# Patient Record
Sex: Male | Born: 1960 | Race: White | Hispanic: No | Marital: Single | Smoking: Never smoker
Health system: Southern US, Community
[De-identification: ages and names within clinical notes are randomized; demographics above are authoritative.]

## PROBLEM LIST (undated history)

## (undated) DIAGNOSIS — M1990 Unspecified osteoarthritis, unspecified site: Secondary | ICD-10-CM

## (undated) DIAGNOSIS — M109 Gout, unspecified: Secondary | ICD-10-CM

## (undated) DIAGNOSIS — E785 Hyperlipidemia, unspecified: Secondary | ICD-10-CM

## (undated) HISTORY — DX: Unspecified osteoarthritis, unspecified site: M19.90

## (undated) HISTORY — PX: ELBOW SURGERY: SHX618

## (undated) HISTORY — PX: SHOULDER SURGERY: SHX246

## (undated) HISTORY — DX: Hyperlipidemia, unspecified: E78.5

## (undated) HISTORY — PX: COLONOSCOPY: SHX174

## (undated) HISTORY — DX: Gout, unspecified: M10.9

---
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
## 1997-07-09 HISTORY — PX: WRIST SURGERY: SHX841

## 1998-03-25 ENCOUNTER — Emergency Department (HOSPITAL_COMMUNITY): Admission: EM | Admit: 1998-03-25 | Discharge: 1998-03-25 | Payer: Self-pay | Admitting: *Deleted

## 1998-08-17 ENCOUNTER — Ambulatory Visit (HOSPITAL_BASED_OUTPATIENT_CLINIC_OR_DEPARTMENT_OTHER): Admission: RE | Admit: 1998-08-17 | Discharge: 1998-08-17 | Payer: Self-pay | Admitting: Orthopedic Surgery

## 1999-08-02 ENCOUNTER — Ambulatory Visit (HOSPITAL_BASED_OUTPATIENT_CLINIC_OR_DEPARTMENT_OTHER): Admission: RE | Admit: 1999-08-02 | Discharge: 1999-08-02 | Payer: Self-pay | Admitting: Orthopedic Surgery

## 2010-07-31 ENCOUNTER — Encounter: Payer: Self-pay | Admitting: Family Medicine

## 2011-07-10 DIAGNOSIS — M109 Gout, unspecified: Secondary | ICD-10-CM

## 2011-07-10 HISTORY — DX: Gout, unspecified: M10.9

## 2015-03-03 ENCOUNTER — Other Ambulatory Visit: Payer: Self-pay | Admitting: Family Medicine

## 2015-03-03 ENCOUNTER — Ambulatory Visit
Admission: RE | Admit: 2015-03-03 | Discharge: 2015-03-03 | Disposition: A | Discharge status: Home / Self Care | Payer: BLUE CROSS/BLUE SHIELD | Source: Ambulatory Visit | Attending: Family Medicine | Admitting: Family Medicine

## 2015-03-03 DIAGNOSIS — M1990 Unspecified osteoarthritis, unspecified site: Secondary | ICD-10-CM

## 2016-07-03 IMAGING — CR DG HAND 2V*L*
2 series · 2 of 2 positions shown · non-contrast
Comparison: None.

CLINICAL DATA: Hand pain, right side greater than left.

EXAM:
LEFT HAND - 2 VIEW

[x hand pa left]
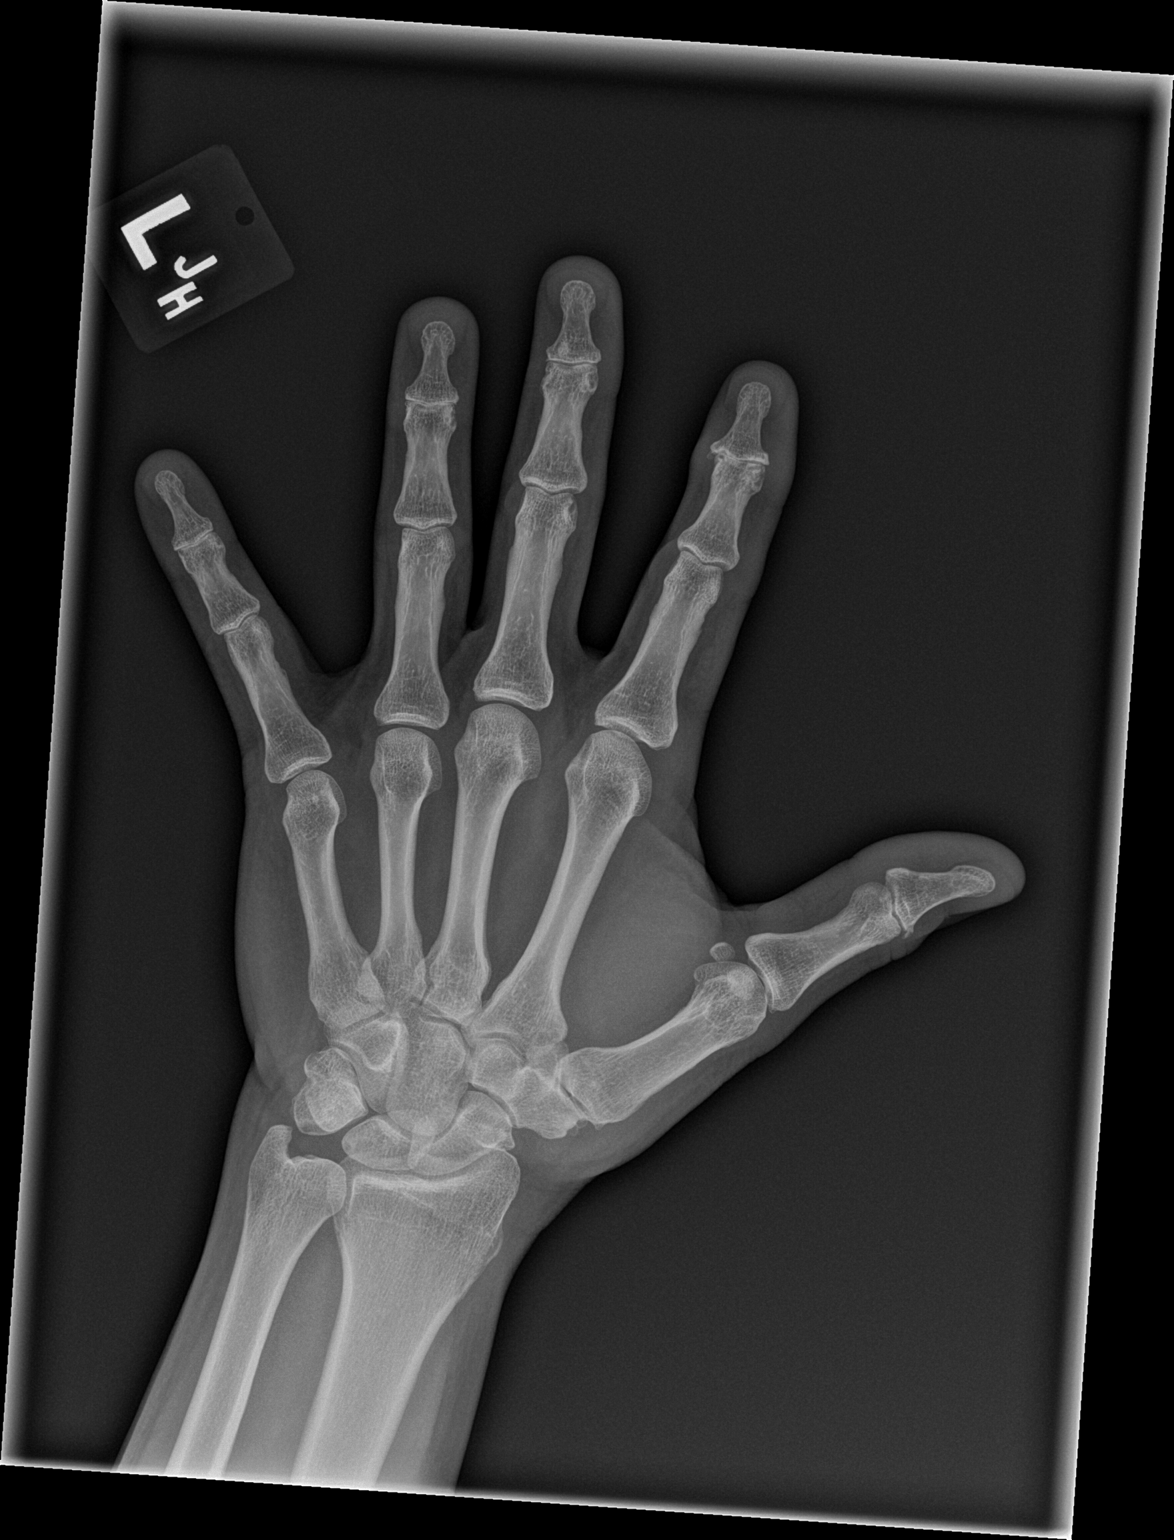

[x hand lat left]
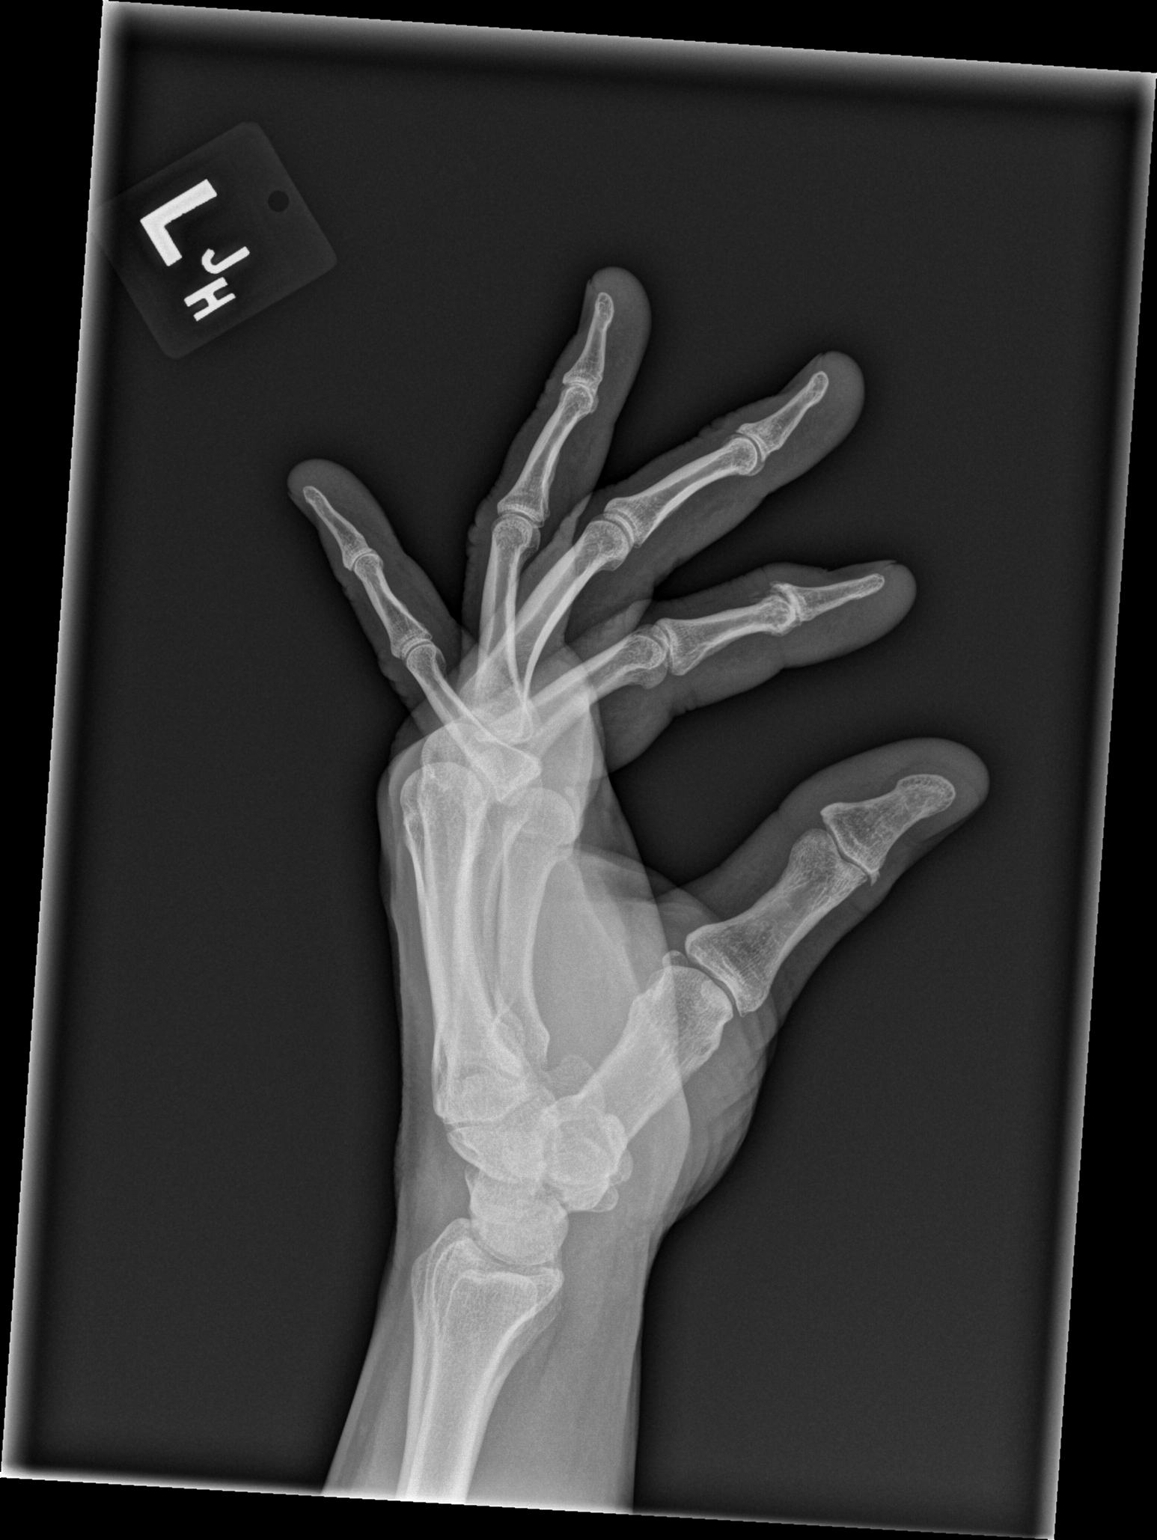

[2 of 2 positions shown; findings below may reference images not displayed]

FINDINGS: No acute bony or joint abnormality identified. No evidence of
fracture or dislocation. DJD base of left thumb at the first
carpometacarpal joint. Degenerative changes noted of the
interphalangeal joints.
IMPRESSION: 1.  No acute abnormality. No evidence of fracture or dislocation .

2. Degenerative changes first carpometacarpal joint and
interphalangeal joints.

## 2017-01-22 ENCOUNTER — Encounter: Payer: Self-pay | Admitting: Student

## 2017-01-22 ENCOUNTER — Ambulatory Visit (INDEPENDENT_AMBULATORY_CARE_PROVIDER_SITE_OTHER): Payer: BLUE CROSS/BLUE SHIELD | Admitting: Student

## 2017-01-22 VITALS — BP 108/68 | HR 52 | Temp 97.7°F | Ht 69.0 in | Wt 178.8 lb

## 2017-01-22 DIAGNOSIS — E785 Hyperlipidemia, unspecified: Secondary | ICD-10-CM

## 2017-01-22 DIAGNOSIS — M19049 Primary osteoarthritis, unspecified hand: Secondary | ICD-10-CM | POA: Insufficient documentation

## 2017-01-22 DIAGNOSIS — R001 Bradycardia, unspecified: Secondary | ICD-10-CM

## 2017-01-22 DIAGNOSIS — R252 Cramp and spasm: Secondary | ICD-10-CM | POA: Diagnosis not present

## 2017-01-22 DIAGNOSIS — Z8739 Personal history of other diseases of the musculoskeletal system and connective tissue: Secondary | ICD-10-CM

## 2017-01-22 DIAGNOSIS — M19041 Primary osteoarthritis, right hand: Secondary | ICD-10-CM | POA: Diagnosis not present

## 2017-01-22 DIAGNOSIS — Z0000 Encounter for general adult medical examination without abnormal findings: Secondary | ICD-10-CM | POA: Insufficient documentation

## 2017-01-22 NOTE — Assessment & Plan Note (Signed)
Lipid panel today. He is fasting. Continue pravastatin and Aspirin for now. He may not need these

## 2017-01-22 NOTE — Assessment & Plan Note (Addendum)
Will review his medical records when I receive them from his previous provider. He has signed a medical release form today. It seems his uptodate on most health maintenance issues. Updated history in his chart. Baseline blood work (CMP, CBC with diff, TSH and lipid panel today) Encouraged him to keep up the exercise and diet Advised not to drink more than two 8 oz beers or equivalent in a day.

## 2017-01-22 NOTE — Assessment & Plan Note (Signed)
Continue allopurinol for now

## 2017-01-22 NOTE — Progress Notes (Signed)
Subjective:     Gary Delgado is a 56 y.o. old male here to establish care.   HPI Patient used to go to [facility] for his care. He says his last visit was about 3 months ago for ear infection. He reports signing a release form about a month ago. Unfortunately, we have not received his medical records yet. He reports having normal colonoscopy about 8 years ago. He says he is up-to-date on his tetanus vaccine. He denies history of hypertension, diabetes, heart disease, lung disease or GI disease. He admits history of OA and gout.  He reports regular exercise. He bikes about 4 days a week. He reports getting some leg cramps after biking that resolves with hydration.   PMH/Problem List: has Hyperlipidemia; Routine adult health maintenance; OA (osteoarthritis) of finger; Hx of gout; and Leg cramping on his problem list.  [facility]  Patient is adopted.   [place] Social History  Substance Use Topics  . Smoking status: Never Smoker  . Smokeless tobacco: Never Used  . Alcohol use Yes     Comment: two cans a day    Additional Depression: Describes his mood as "happy" EtOH abuse: occ > 2 beers  Tobacco use: never smoker Drug use: Denies Work: Educational psychologist for [employer] Exercise: rids a bike 4 days a week Diet: eat healthy as possible except chips.  Sexual activity: married Has a living will and MOST  Screening Colon Cancer Screening: in 2010, normal Sleep Apnea Screening: Negative  Skin Cancer Screening: no skin lesion Opthalmology Visit: had his eye checked two years ago Dental Visit: a bout a month ago  Immunizations Says he had tetanus vaccine at [facility]   Review of Systems  Constitutional: Negative for diaphoresis, fatigue, fever and unexpected weight change.  HENT: Negative for congestion, hearing loss, trouble swallowing and voice change.   Eyes: Negative for visual disturbance.  Respiratory: Negative for cough, chest tightness and shortness of breath.     Cardiovascular: Negative for chest pain, palpitations and leg swelling.  Gastrointestinal: Negative for abdominal pain, blood in stool and diarrhea.  Endocrine: Negative for cold intolerance, heat intolerance, polydipsia, polyphagia and polyuria.  Genitourinary: Negative for dysuria, frequency, hematuria and scrotal swelling.  Musculoskeletal: Negative for arthralgias and myalgias.  Skin: Negative for rash.  Neurological: Negative for dizziness, light-headedness and headaches.  Hematological: Negative for adenopathy. Does not bruise/bleed easily.  Psychiatric/Behavioral: Negative for dysphoric mood. The patient is not nervous/anxious.        Objective:   Physical Exam Vitals:   01/22/17 0852  BP: 108/68  Pulse: (!) 52  Temp: 97.7 F (36.5 C)  TempSrc: Oral  SpO2: 98%  Weight: 178 lb 12.8 oz (81.1 kg)  Height: 5\' 9"  (1.753 m)   Body mass index is 26.4 kg/m.  GEN: appears well, no apparent distress. Head: normocephalic and atraumatic  Eyes: conjunctiva without injection, sclera anicteric Ears: external ear and ear canal normal Nares: no rhinorrhea, congestion or erythema Oropharynx: mmm without erythema or exudation HEM: negative for cervical or periauricular lymphadenopathies CVS: Bradycardia to 52, RR, nl s1 & s2, no murmurs, no edema,  2+ DP & PT pulses bilaterally RESP: no IWOB, good air movement bilaterally, CTAB GI: BS present & normal, soft, NTND, no guarding, no rebound, no mass GU: no suprapubic or CVA tenderness MSK: no focal tenderness or notable swelling. Heberden's nodes on DIP SKIN: no apparent skin lesion ENDO: negative thyromegally NEURO: alert and oiented appropriately, no gross defecits  PSYCH: mood "  happy" with congruent affect     Assessment & Plan:  Routine adult health maintenance Will review his medical records when I receive them from his previous provider. He has signed a medical release form today. It seems his uptodate on most health  maintenance issues. Updated history in his chart. Baseline blood work (CMP, CBC with diff, TSH and lipid panel today) Encouraged him to keep up the exercise and diet Advised not to drink more than two 8 oz beers or equivalent in a day.    Hyperlipidemia Lipid panel today. He is fasting. Continue pravastatin and Aspirin for now. He may not need these  Leg cramping Likely due to dehydration from over exertion. Will check CMP to rule out electrolyte issues.   Hx of gout Continue allopurinol for now

## 2017-01-22 NOTE — Patient Instructions (Addendum)
It was great seeing you today! We have addressed the following issues today 1. Staying healthy: Keep up the good work with exercise and diet 2.   For your medical records: someone will get in touch when we receive your medical records from [place] if we have questions or concerns. Otherwise, we will see you back in a year.   If we did any lab work today, and the results require attention, either me or my nurse will get in touch with you. If everything is normal, you will get a letter in mail and a message via . If you don't hear from [place] in two weeks, please give [place] a call. Otherwise, we look forward to seeing you again at your next visit. If you have any questions or concerns before then, please call the clinic at [phone].  Please bring all your medications to every doctors visit  Sign up for My Chart to have easy access to your labs results, and communication with your Primary care physician.    Please check-out at the front desk before leaving the clinic.    Take Care,   Dr. Cyndia Skeeters

## 2017-01-22 NOTE — Assessment & Plan Note (Signed)
Likely due to dehydration from over exertion. Will check CMP to rule out electrolyte issues.

## 2017-01-24 ENCOUNTER — Encounter: Payer: Self-pay | Admitting: Student

## 2017-01-24 LAB — CBC WITH DIFFERENTIAL/PLATELET
BASOS: 1 %
Basophils Absolute: 0.1 10*3/uL (ref 0.0–0.2)
EOS (ABSOLUTE): 0.1 10*3/uL (ref 0.0–0.4)
EOS: 2 %
HEMATOCRIT: 49.1 % (ref 37.5–51.0)
Hemoglobin: 15 g/dL (ref 13.0–17.7)
Immature Grans (Abs): 0 10*3/uL (ref 0.0–0.1)
Immature Granulocytes: 0 %
LYMPHS ABS: 1.7 10*3/uL (ref 0.7–3.1)
Lymphs: 33 %
MCH: 28.7 pg (ref 26.6–33.0)
MCHC: 30.5 g/dL — AB (ref 31.5–35.7)
MCV: 94 fL (ref 79–97)
MONOS ABS: 0.4 10*3/uL (ref 0.1–0.9)
Monocytes: 7 %
Neutrophils Absolute: 3 10*3/uL (ref 1.4–7.0)
Neutrophils: 57 %
Platelets: 321 10*3/uL (ref 150–379)
RBC: 5.23 x10E6/uL (ref 4.14–5.80)
RDW: 13.7 % (ref 12.3–15.4)
WBC: 5.2 10*3/uL (ref 3.4–10.8)

## 2017-01-24 LAB — LIPID PANEL
CHOLESTEROL TOTAL: 188 mg/dL (ref 100–199)
Chol/HDL Ratio: 3.4 ratio (ref 0.0–5.0)
HDL: 56 mg/dL (ref >39)
LDL Calculated: 114 mg/dL — ABNORMAL HIGH (ref 0–99)
TRIGLYCERIDES: 88 mg/dL (ref 0–149)
VLDL CHOLESTEROL CAL: 18 mg/dL (ref 5–40)

## 2017-01-24 LAB — CMP14+EGFR
A/G RATIO: 2.2 (ref 1.2–2.2)
ALBUMIN: 5 g/dL (ref 3.5–5.5)
ALK PHOS: 40 IU/L (ref 39–117)
ALT: 25 IU/L (ref 0–44)
AST: 29 IU/L (ref 0–40)
BUN / CREAT RATIO: 15 (ref 9–20)
BUN: 17 mg/dL (ref 6–24)
Bilirubin Total: 0.7 mg/dL (ref 0.0–1.2)
CO2: 22 mmol/L (ref 20–29)
CREATININE: 1.14 mg/dL (ref 0.76–1.27)
Calcium: 10.3 mg/dL — ABNORMAL HIGH (ref 8.7–10.2)
Chloride: 100 mmol/L (ref 96–106)
GFR calc Af Amer: 81 mL/min/{1.73_m2} (ref >59)
GFR calc non Af Amer: 70 mL/min/{1.73_m2} (ref >59)
GLOBULIN, TOTAL: 2.3 g/dL (ref 1.5–4.5)
Glucose: 94 mg/dL (ref 65–99)
Potassium: 4.9 mmol/L (ref 3.5–5.2)
SODIUM: 141 mmol/L (ref 134–144)
Total Protein: 7.3 g/dL (ref 6.0–8.5)

## 2017-01-24 LAB — TSH: TSH: 2.01 u[IU]/mL (ref 0.450–4.500)

## 2017-05-28 ENCOUNTER — Telehealth: Payer: Self-pay | Admitting: Student

## 2017-05-28 MED ORDER — ALLOPURINOL 100 MG PO TABS: 100.0000 mg | ORAL_TABLET | Freq: Every day | ORAL | 3 refills | Status: DC

## 2017-05-28 NOTE — Telephone Encounter (Signed)
Refilled his allopurinol

## 2017-05-28 NOTE — Telephone Encounter (Signed)
Pt called and said he got a phone call from pharmacy saying his meds were ready to pick up. He got to the pharmacy and only the pravastatin was ready and they said he would need a refill from dr for the allopurinol. Pt said both the prescriptions have his old dr name on them and he wants to get that changed since he comes here now. He needs a refill on allopurinal now with new PCP name on it and the refill for pravastatin also needs new PCP on it for the future. Please advise

## 2017-07-18 ENCOUNTER — Other Ambulatory Visit: Payer: Self-pay

## 2017-07-18 ENCOUNTER — Encounter: Payer: Self-pay | Admitting: Student in an Organized Health Care Education/Training Program

## 2017-07-18 ENCOUNTER — Ambulatory Visit (INDEPENDENT_AMBULATORY_CARE_PROVIDER_SITE_OTHER): Payer: BLUE CROSS/BLUE SHIELD | Admitting: Student in an Organized Health Care Education/Training Program

## 2017-07-18 DIAGNOSIS — L570 Actinic keratosis: Secondary | ICD-10-CM | POA: Insufficient documentation

## 2017-07-18 DIAGNOSIS — L819 Disorder of pigmentation, unspecified: Secondary | ICD-10-CM | POA: Diagnosis not present

## 2017-07-18 NOTE — Assessment & Plan Note (Addendum)
Right sided nose lesion, small. - Asked patient to follow up in procedures/derm clinic in our office for lesion to be frozen off Patient agreeable to this plan.

## 2017-07-18 NOTE — Progress Notes (Signed)
   CC: Skin lesions  HPI: Gary Delgado is a 57 y.o. male with PMH significant for HLD, gout who presents to [facility] today with two skin lesions he would like [place] to look.   Face lesion: Patient reports left sided nose lesion of 6 months duration. It is small, slightly raised, and when he scratches at it the lesion will bleed. It will go away for a while but always comes back. No drainage or redness surrounding the lesion. No known history of skin cancer. Positive history of exposure to sunlight. No known family history, patient is adopted. No similar lesions elsewhere.  Right shoulder lesion: Flat ring of slight hyperpigmentation that has lasted multiple years. The lesion is a little bit darker in the summer. It has not grown. It has not changed color. It is not raised. It does not have irregular borders.   Review of Symptoms:  See HPI for ROS.   CC, SH/smoking status, and VS noted.  Objective: BP 118/70 (BP Location: Left Arm, Patient Position: Sitting, Cuff Size: Large)   Pulse 77   Temp 98 F (36.7 C) (Oral)   Ht 5\' 9"  (1.753 m)   Wt 189 lb (85.7 kg)   SpO2 99%   BMI 27.91 kg/m  GEN: NAD, alert, cooperative, and pleasant. SKIN:  Face: +small raised lesion with roughness to the touch and small associated telangectasias. No redness or drainage, no warmth. Shoulder: Right shoulder flat area of hyperpigmentation, clear in the middle. Confluent with the rest of the skin (not raised).  Assessment and plan:  Actinic keratosis of the right nose Right sided nose lesion, small. - Asked patient to follow up in procedures/derm clinic in our office for lesion to be frozen off Patient agreeable to this plan.  Hyperpigmentation 3 cm ring if mild hyperpigmentation on right shoulder, not concerning for melanoma. Not likely fungal given duration of 2-3 years without change. - monitor  Everrett Coombe, MD,MS,  PGY2 07/18/2017 11:43 AM

## 2017-07-18 NOTE — Patient Instructions (Signed)
It was a pleasure seeing you today in our clinic. Today we discussed your skin lesions. Here is the treatment plan we have discussed and agreed upon together:  For the lesion on your face, please schedule a visit to be seen in procedures clinic so that it may be frozen off.  Our clinic's number is [phone]. Please call with questions or concerns about what we discussed today.  Be well, Dr. Burr Medico

## 2017-07-18 NOTE — Assessment & Plan Note (Signed)
3 cm ring if mild hyperpigmentation on right shoulder, not concerning for melanoma. Not likely fungal given duration of 2-3 years without change. - monitor

## 2017-08-08 ENCOUNTER — Other Ambulatory Visit: Payer: Self-pay

## 2017-08-08 ENCOUNTER — Ambulatory Visit (INDEPENDENT_AMBULATORY_CARE_PROVIDER_SITE_OTHER): Payer: BLUE CROSS/BLUE SHIELD | Admitting: Family Medicine

## 2017-08-08 VITALS — BP 106/74 | HR 64 | Temp 97.3°F | Wt 188.0 lb

## 2017-08-08 DIAGNOSIS — L821 Other seborrheic keratosis: Secondary | ICD-10-CM

## 2017-08-08 DIAGNOSIS — L719 Rosacea, unspecified: Secondary | ICD-10-CM

## 2017-08-08 MED ORDER — METRONIDAZOLE 0.75 % EX CREA: TOPICAL_CREAM | Freq: Two times a day (BID) | CUTANEOUS | 0 refills | Status: DC

## 2017-08-08 NOTE — Patient Instructions (Signed)
Rosacea Rosacea is a long-term (chronic) condition that affects the skin of the face, including the cheeks, nose, brow, and chin. This condition can also affect the eyes. Rosacea causes blood vessels near the surface of the skin to enlarge, which results in redness. What are the causes? The cause of this condition is not known. Certain triggers can make rosacea worse, including:  Hot baths.  Exercise.  Sunlight.  Very hot or cold temperatures.  Hot or spicy foods and drinks.  Drinking alcohol.  Stress.  Taking blood pressure medicine.  Long-term use of topical steroids on the face.  What increases the risk? This condition is more likely to develop in:  People who are older than 59 years of age.  Women.  People who have light-colored skin (light complexion).  People who have a family history of rosacea.  What are the signs or symptoms? Symptoms of this condition include:  Redness of the face.  Red bumps or pimples on the face.  A red, enlarged nose.  Blushing easily.  Red lines on the skin.  Irritated or burning feeling in the eyes.  Swollen eyelids.  How is this diagnosed? This condition is diagnosed with a medical history and physical exam. How is this treated? There is no cure for this condition, but treatment can help to control your symptoms. Your health care provider may recommend that you see a skin specialist (dermatologist). Treatment may include:  Antibiotic medicines that are applied to the skin or taken as a pill.  Laser treatment to improve the appearance of the skin.  Surgery. This is rare.  Your health care provider will also recommend the best way to take care of your skin. Even after your skin improves, you will likely need to continue treatment to prevent your rosacea from coming back. Follow these instructions at home: Skin Care Take care of your skin as told by your health care provider. You may be told to do these things:  Wash  your skin gently two or more times each day.  Use mild soap.  Use a sunscreen or sunblock with SPF 30 or greater.  Use gentle cosmetics that are meant for sensitive skin.  Shave with an electric shaver instead of a blade.  Lifestyle  Try to keep track of what foods trigger this condition. Avoid any triggers. These may include: ? Spicy foods. ? Seafood. ? Cheese. ? Hot liquids. ? Nuts. ? Chocolate. ? Iodized salt.  Do not drink alcohol.  Avoid extremely cold or hot temperatures.  Try to reduce your stress. If you need help, talk with your health care provider.  When you exercise, do these things to stay cool: ? Limit your sun exposure. ? Use a fan. ? Do shorter and more frequent intervals of exercise. General instructions  Keep all follow-up visits as told by your health care provider. This is important.  Take over-the-counter and prescription medicines only as told by your health care provider.  If your eyelids are affected, apply warm compresses to them. Do this as told by your health care provider.  If you were prescribed an antibiotic medicine, apply or take it as told by your health care provider. Do not stop using the antibiotic even if your condition improves. Contact a health care provider if:  Your symptoms get worse.  Your symptoms do not improve after two months of treatment.  You have new symptoms.  You have any changes in vision or you have problems with your eyes, such as   redness or itching.  You feel depressed.  You lose your appetite.  You have trouble concentrating. This information is not intended to replace advice given to you by your health care provider. Make sure you discuss any questions you have with your health care provider. Document Released: 08/02/2004 Document Revised: 12/01/2015 Document Reviewed: 09/01/2014 Elsevier Interactive Patient Education  2018 Elsevier Inc.  

## 2017-08-08 NOTE — Progress Notes (Signed)
   Subjective:    Patient ID: Gary Delgado, male    DOB: 1960/09/26, 57 y.o.   MRN: [id number]   CC: Skin lesion on nose  HPI: Patient is 57 yo male who presents today for skin lesion on his nose. Patient was referred to [facility] by Dr.Feng on 07/18/17 for cryotherapy after a diagnosis of actinic keratosis. Patient reports that lesion has been present since the summer. He has been picking at it and it occasionally bleeds but but never really heals and has been recurring. Patient has not tried any medications or therapy for it. Patient also complain of skin lesion that he has noticed on his back on the left side. It has not bother him but wanted to know if it was concerning for skin cancer.  Smoking status reviewed   ROS: all other systems were reviewed and are negative other than in the HPI   Past Medical History:  Diagnosis Date  . Gout 2013   Never had a flare up since then  . Osteoarthritis     Past Surgical History:  Procedure Laterality Date  . ELBOW SURGERY Right   . KNEE SURGERY Left 1970  . SHOULDER SURGERY Left   . WRIST SURGERY Right 1999    Past medical history, surgical, family, and social history reviewed and updated in the EMR as appropriate.  Objective:  BP 106/74   Pulse 64   Temp (!) 97.3 F (36.3 C) (Oral)   Wt 188 lb (85.3 kg)   SpO2 95%   BMI 27.76 kg/m   Vitals and nursing note reviewed      General: NAD, pleasant, able to participate in exam Cardiac: RRR, normal heart sounds, no murmurs. 2+ radial and PT pulses bilaterally Respiratory: CTAB, normal effort, No wheezes, rales or rhonchi Abdomen: soft, nontender, nondistended, no hepatic or splenomegaly, +BS Extremities: no edema or cyanosis. WWP. Skin: small erythematous lesion on bridge of nose associated with Telangiectasias, not scaly, or raised, non bleeding or draining, non tender. No fluctuance. 1 cm x 1 cm raised papules well circumscribed, slightly hyperpigmented, no scales or  erythema associated. Neuro: alert and oriented x4, no focal deficits Psych: Normal affect and mood   Assessment & Plan:   #Nose lesion, chronic, unchanged  Patient presented for cryotherapy for actinic keratosis diagnosis. On exam, lesion is more consistent to rosacea with associated telangiectasia. Does not have scaly appearance found in [place]. Will treat as rosacea and continue to monitor. Counseled patient on trigger such as spicy food, excessive sunlight, alcohol, etc. If lesion does not improve with above plan, patient will return for biopsy and further evaluation. --Metronidazole 0.75 % cream apply bid to affected areas for two weeks --Follow up in clinic if lesions does not improve  #Back lesion, acute Lesion on back consistent with seborrhoic keratosis with "stuck on" appearance raised border and well demarcated. No pruritus reported. Will continue to monitor. Patient decline cryotherapy for the time being.    Marjie Skiff, MD [facility] PGY-2

## 2017-11-21 ENCOUNTER — Telehealth: Payer: Self-pay | Admitting: Family Medicine

## 2017-11-21 NOTE — Telephone Encounter (Signed)
Patient was seen at the dermatology clinic in January for lesion on his nose which appears to be rosacea. I called to follow-up with him and to schedule follow-up appointment for reassessment as recommended.  (monitoring recommended during last visit).  He stated he is upset because he was initially misdiagnosed by the resident and told he has skin cancer. He had to come in to be seen twice and he got a $200 bill for both visits which he had to pay out of pocket.  I apologize for the misconception and advised him that we will like to work with him and make things right. 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 He understands and he will let us know if he will like to schedule an appointment with [place].  So far, he stated his nose is fine and there has been no change to his facial skin since last visit.

## 2017-12-19 ENCOUNTER — Encounter

## 2018-06-17 ENCOUNTER — Telehealth: Payer: Self-pay | Admitting: Family Medicine

## 2018-06-17 NOTE — Telephone Encounter (Signed)
Spoke with pt about overdue colonoscopy. Pt states he has switched providers due to how his last appt went a year ago and had already spoken with someone in the [facility] about having switched. He was not the happiest about receiving a phone call. -[place]

## 2019-03-23 ENCOUNTER — Encounter: Payer: Self-pay | Admitting: Internal Medicine

## 2019-04-02 ENCOUNTER — Ambulatory Visit (AMBULATORY_SURGERY_CENTER): Payer: Self-pay | Admitting: *Deleted

## 2019-04-02 ENCOUNTER — Other Ambulatory Visit: Payer: Self-pay

## 2019-04-02 VITALS — Temp 96.6°F | Ht 69.0 in | Wt 180.0 lb

## 2019-04-02 DIAGNOSIS — Z1211 Encounter for screening for malignant neoplasm of colon: Secondary | ICD-10-CM

## 2019-04-02 MED ORDER — SUPREP BOWEL PREP KIT 17.5-3.13-1.6 GM/177ML PO SOLN: 1.0000 | Freq: Once | ORAL | 0 refills | Status: AC

## 2019-04-02 NOTE — Progress Notes (Signed)
No egg or soy allergy known to patient  No issues with past sedation with any surgeries  or procedures, no intubation problems  No diet pills per patient- hard to wake post colon  No home 02 use per patient  No blood thinners per patient  Pt denies issues with constipation  No A fib or A flutter  EMMI video sent to pt's e mail  $15 suprep coupon   Due to the COVID-19 pandemic we are asking patients to follow these guidelines. Please only bring one care partner. Please be aware that your care partner may wait in the car in the parking lot or if they feel like they will be too hot to wait in the car, they may wait in the lobby on the 4th floor. All care partners are required to wear a mask the entire time (we do not have any that we can provide them), they need to practice social distancing, and we will do a Covid check for all patient's and care partners when you arrive. Also we will check their temperature and your temperature. If the care partner waits in their car they need to stay in the parking lot the entire time and we will call them on their cell phone when the patient is ready for discharge so they can bring the car to the front of the building. Also all patient's will need to wear a mask into building.

## 2019-04-03 ENCOUNTER — Encounter: Payer: Self-pay | Admitting: Internal Medicine

## 2019-04-14 ENCOUNTER — Telehealth: Payer: Self-pay

## 2019-04-14 NOTE — Telephone Encounter (Signed)
Covid-19 screening questions   Do you now or have you had a fever in the last 14 days? NO   Do you have any respiratory symptoms of shortness of breath or cough now or in the last 14 days? NO  Do you have any family members or close contacts with diagnosed or suspected Covid-19 in the past 14 days? NO  Have you been tested for Covid-19 and found to be positive? NO        

## 2019-04-15 ENCOUNTER — Other Ambulatory Visit: Payer: Self-pay

## 2019-04-15 ENCOUNTER — Encounter: Payer: Self-pay | Admitting: Internal Medicine

## 2019-04-15 ENCOUNTER — Other Ambulatory Visit: Payer: Self-pay | Admitting: Internal Medicine

## 2019-04-15 ENCOUNTER — Ambulatory Visit (AMBULATORY_SURGERY_CENTER): Payer: BC Managed Care – PPO | Admitting: Internal Medicine

## 2019-04-15 VITALS — BP 110/72 | HR 55 | Temp 98.2°F | Resp 14 | Ht 69.0 in | Wt 180.0 lb

## 2019-04-15 DIAGNOSIS — D122 Benign neoplasm of ascending colon: Secondary | ICD-10-CM

## 2019-04-15 DIAGNOSIS — Z1211 Encounter for screening for malignant neoplasm of colon: Secondary | ICD-10-CM | POA: Diagnosis present

## 2019-04-15 MED ORDER — SODIUM CHLORIDE 0.9 % IV SOLN: 500.0000 mL | INTRAVENOUS | Status: AC

## 2019-04-15 NOTE — Progress Notes (Signed)
A and O x3. Report to RN. Tolerated MAC anesthesia well.

## 2019-04-15 NOTE — Patient Instructions (Signed)
YOU HAD AN ENDOSCOPIC PROCEDURE TODAY AT THE [facility]:   Refer to the procedure report that was given to you for any specific questions about what was found during the examination.  If the procedure report does not answer your questions, please call your gastroenterologist to clarify.  If you requested that your care partner not be given the details of your procedure findings, then the procedure report has been included in a sealed envelope for you to review at your convenience later.  YOU SHOULD EXPECT: Some feelings of bloating in the abdomen. Passage of more gas than usual.  Walking can help get rid of the air that was put into your GI tract during the procedure and reduce the bloating. If you had a lower endoscopy (such as a colonoscopy or flexible sigmoidoscopy) you may notice spotting of blood in your stool or on the toilet paper. If you underwent a bowel prep for your procedure, you may not have a normal bowel movement for a few days.  Please Note:  You might notice some irritation and congestion in your nose or some drainage.  This is from the oxygen used during your procedure.  There is no need for concern and it should clear up in a day or so.  SYMPTOMS TO REPORT IMMEDIATELY:   Following lower endoscopy (colonoscopy or flexible sigmoidoscopy):  Excessive amounts of blood in the stool  Significant tenderness or worsening of abdominal pains  Swelling of the abdomen that is new, acute  Fever of 100F or higher   For urgent or emergent issues, a gastroenterologist can be reached at any hour by calling [phone].   DIET:  We do recommend a small meal at first, but then you may proceed to your regular diet.  Drink plenty of fluids but you should avoid alcoholic beverages for 24 hours.  MEDICATIONS: Continue present medications.  Please see handouts given to you by your recovery nurse.  ACTIVITY:  You should plan to take it easy for the rest of today and you should  NOT DRIVE or use heavy machinery until tomorrow (because of the sedation medicines used during the test).    FOLLOW UP: Our staff will call the number listed on your records 48-72 hours following your procedure to check on you and address any questions or concerns that you may have regarding the information given to you following your procedure. If we do not reach you, we will leave a message.  We will attempt to reach you two times.  During this call, we will ask if you have developed any symptoms of COVID 19. If you develop any symptoms (ie: fever, flu-like symptoms, shortness of breath, cough etc.) before then, please call [phone].  If you test positive for Covid 19 in the 2 weeks post procedure, please call and report this information to us.    If any biopsies were taken you will be contacted by phone or by letter within the next 1-3 weeks.  Please call us at [phone] if you have not heard about the biopsies in 3 weeks.   Thank you for allowing us to provide for your healthcare needs today.   SIGNATURES/CONFIDENTIALITY: You and/or your care partner have signed paperwork which will be entered into your electronic medical record.  These signatures attest to the fact that that the information above on your After Visit Summary has been reviewed and is understood.  Full responsibility of the confidentiality of this discharge information lies with you and/or   your care-partner. 

## 2019-04-15 NOTE — Progress Notes (Signed)
KA - Temp CW- VS  Pt's states no medical or surgical changes since previsit or office visit.   

## 2019-04-15 NOTE — Progress Notes (Signed)
Called to room to assist during endoscopic procedure.  Patient ID and intended procedure confirmed with present staff. Received instructions for my participation in the procedure from the performing physician.  

## 2019-04-15 NOTE — Op Note (Signed)
[facility] Patient Name: Gary Delgado Procedure Date: 04/15/2019 9:58 AM MRN: [id number] Endoscopist: Docia Chuck. Henrene Pastor , MD Age: 58 Referring MD:  Date of Birth: [birth year] Gender: Male Account #: [id number] Procedure:                Colonoscopy with cold snare polypectomy x 1 Indications:              Screening for colorectal malignant neoplasm. The                            patient reports unremarkable colonoscopy 10 years                            ago Medicines:                Monitored Anesthesia Care Procedure:                Pre-Anesthesia Assessment:                           - Prior to the procedure, a History and Physical                            was performed, and patient medications and                            allergies were reviewed. The patient's tolerance of                            previous anesthesia was also reviewed. The risks                            and benefits of the procedure and the sedation                            options and risks were discussed with the patient.                            All questions were answered, and informed consent                            was obtained. Prior Anticoagulants: The patient has                            taken no previous anticoagulant or antiplatelet                            agents. ASA Grade Assessment: II - A patient with                            mild systemic disease. After reviewing the risks                            and benefits, the patient was deemed in  satisfactory condition to undergo the procedure.                           After obtaining informed consent, the colonoscope                            was passed under direct vision. Throughout the                            procedure, the patient's blood pressure, pulse, and                            oxygen saturations were monitored continuously. The                            Colonoscope was  introduced through the anus and                            advanced to the the cecum, identified by                            appendiceal orifice and ileocecal valve. The                            ileocecal valve, appendiceal orifice, and rectum                            were photographed. The quality of the bowel                            preparation was excellent. The colonoscopy was                            performed without difficulty. The patient tolerated                            the procedure well. The bowel preparation used was                            SUPREP via split dose instruction. Scope In: 10:04:04 AM Scope Out: 10:18:55 AM Scope Withdrawal Time: 0 hours 12 minutes 19 seconds  Total Procedure Duration: 0 hours 14 minutes 51 seconds  Findings:                 A 4 mm polyp was found in the ascending colon. The                            polyp was sessile. The polyp was removed with a                            cold snare. Resection and retrieval were complete.                           A few medium-mouthed diverticula were found in  the                            right colon.                           Internal hemorrhoids were found during retroflexion.                           The exam was otherwise without abnormality on                            direct and retroflexion views. Complications:            No immediate complications. Estimated blood loss:                            None. Estimated Blood Loss:     Estimated blood loss: none. Impression:               - One 4 mm polyp in the ascending colon, removed                            with a cold snare. Resected and retrieved.                           - Diverticulosis in the right colon.                           - Internal hemorrhoids.                           - The examination was otherwise normal on direct                            and retroflexion views. Recommendation:           - Repeat colonoscopy  in 7 years for surveillance if                            polyp adenomatous, otherwise 10.                           - Patient has a contact number available for                            emergencies. The signs and symptoms of potential                            delayed complications were discussed with the                            patient. Return to normal activities tomorrow.                            Written discharge instructions were provided to the  patient.                           - Resume previous diet.                           - Continue present medications.                           - Await pathology results. Docia Chuck. Henrene Pastor, MD 04/15/2019 10:26:12 AM This report has been signed electronically.

## 2019-04-17 ENCOUNTER — Telehealth: Payer: Self-pay | Admitting: *Deleted

## 2019-04-17 ENCOUNTER — Telehealth: Payer: Self-pay

## 2019-04-17 NOTE — Telephone Encounter (Signed)
Attempted to reach pt. With follow-up call following endoscopic procedure 04/15/2019.  LM on pt. Voice mail.  Will try to reach pt. Again later today.

## 2019-04-17 NOTE — Telephone Encounter (Signed)
  Follow up Call-  Call back number 04/15/2019  Post procedure Call Back phone  # [phone] cell  Permission to leave phone message Yes  Some recent data might be hidden     Patient questions:  Do you have a fever, pain , or abdominal swelling? No. Pain Score  0 *  Have you tolerated food without any problems? Yes.    Have you been able to return to your normal activities? Yes.    Do you have any questions about your discharge instructions: Diet   No. Medications  No. Follow up visit  No.  Do you have questions or concerns about your Care? No.  Actions: * If pain score is 4 or above: No action needed, pain <4.  1. Have you developed a fever since your procedure? NO  2.   Have you had an respiratory symptoms (SOB or cough) since your procedure? NO  3.   Have you tested positive for COVID 19 since your procedure NO  4.   Have you had any family members/close contacts diagnosed with the COVID 19 since your procedure?  NO   If yes to any of these questions please route to Joylene John, RN and Alphonsa Gin, RN.

## 2019-04-21 ENCOUNTER — Encounter: Payer: Self-pay | Admitting: Internal Medicine
